# Patient Record
Sex: Male | Born: 1980 | Race: White | Hispanic: No | Marital: Married | Smoking: Current every day smoker
Health system: Southern US, Community
[De-identification: ages and names within clinical notes are randomized; demographics above are authoritative.]

## PROBLEM LIST (undated history)

## (undated) DIAGNOSIS — R1310 Dysphagia, unspecified: Secondary | ICD-10-CM

## (undated) DIAGNOSIS — R519 Headache, unspecified: Secondary | ICD-10-CM

## (undated) DIAGNOSIS — T7840XA Allergy, unspecified, initial encounter: Secondary | ICD-10-CM

## (undated) DIAGNOSIS — J45909 Unspecified asthma, uncomplicated: Secondary | ICD-10-CM

## (undated) DIAGNOSIS — Z8614 Personal history of Methicillin resistant Staphylococcus aureus infection: Secondary | ICD-10-CM

## (undated) DIAGNOSIS — R51 Headache: Secondary | ICD-10-CM

## (undated) DIAGNOSIS — S42001A Fracture of unspecified part of right clavicle, initial encounter for closed fracture: Secondary | ICD-10-CM

## (undated) DIAGNOSIS — I499 Cardiac arrhythmia, unspecified: Secondary | ICD-10-CM

## (undated) HISTORY — PX: CHOLECYSTECTOMY: SHX55

## (undated) HISTORY — PX: WISDOM TOOTH EXTRACTION: SHX21

## (undated) HISTORY — DX: Allergy, unspecified, initial encounter: T78.40XA

## (undated) HISTORY — PX: WRIST RECONSTRUCTION: SHX2675

## (undated) HISTORY — PX: TONSILLECTOMY: SUR1361

---

## 1999-02-06 ENCOUNTER — Encounter: Payer: Self-pay | Admitting: Orthopaedic Surgery

## 1999-02-06 ENCOUNTER — Inpatient Hospital Stay (HOSPITAL_COMMUNITY): Admission: EM | Admit: 1999-02-06 | Discharge: 1999-02-08 | Payer: Self-pay

## 1999-02-06 ENCOUNTER — Encounter: Payer: Self-pay | Admitting: Emergency Medicine

## 1999-06-16 ENCOUNTER — Encounter: Admission: RE | Admit: 1999-06-16 | Discharge: 1999-06-27 | Payer: Self-pay | Admitting: Orthopaedic Surgery

## 2000-02-06 HISTORY — PX: WRIST RECONSTRUCTION: SHX2675

## 2017-08-20 ENCOUNTER — Emergency Department (HOSPITAL_COMMUNITY): Payer: Managed Care, Other (non HMO)

## 2017-08-20 ENCOUNTER — Encounter (HOSPITAL_COMMUNITY): Payer: Self-pay

## 2017-08-20 ENCOUNTER — Other Ambulatory Visit: Payer: Self-pay

## 2017-08-20 ENCOUNTER — Emergency Department (HOSPITAL_COMMUNITY)
Admission: EM | Admit: 2017-08-20 | Discharge: 2017-08-21 | Disposition: A | Discharge status: Home / Self Care | Payer: Managed Care, Other (non HMO) | Attending: Emergency Medicine | Admitting: Emergency Medicine

## 2017-08-20 DIAGNOSIS — Y929 Unspecified place or not applicable: Secondary | ICD-10-CM | POA: Insufficient documentation

## 2017-08-20 DIAGNOSIS — R410 Disorientation, unspecified: Secondary | ICD-10-CM | POA: Diagnosis not present

## 2017-08-20 DIAGNOSIS — Y999 Unspecified external cause status: Secondary | ICD-10-CM | POA: Insufficient documentation

## 2017-08-20 DIAGNOSIS — S42021A Displaced fracture of shaft of right clavicle, initial encounter for closed fracture: Secondary | ICD-10-CM | POA: Insufficient documentation

## 2017-08-20 DIAGNOSIS — R112 Nausea with vomiting, unspecified: Secondary | ICD-10-CM | POA: Diagnosis not present

## 2017-08-20 DIAGNOSIS — Z79899 Other long term (current) drug therapy: Secondary | ICD-10-CM | POA: Diagnosis not present

## 2017-08-20 DIAGNOSIS — S42001A Fracture of unspecified part of right clavicle, initial encounter for closed fracture: Secondary | ICD-10-CM

## 2017-08-20 DIAGNOSIS — Y939 Activity, unspecified: Secondary | ICD-10-CM | POA: Insufficient documentation

## 2017-08-20 DIAGNOSIS — S4991XA Unspecified injury of right shoulder and upper arm, initial encounter: Secondary | ICD-10-CM | POA: Diagnosis present

## 2017-08-20 HISTORY — DX: Fracture of unspecified part of right clavicle, initial encounter for closed fracture: S42.001A

## 2017-08-20 LAB — CBC WITH DIFFERENTIAL/PLATELET
Basophils Absolute: 0 10*3/uL (ref 0.0–0.1)
Basophils Relative: 0 %
Eosinophils Absolute: 0.1 10*3/uL (ref 0.0–0.7)
Eosinophils Relative: 0 %
HEMATOCRIT: 44.7 % (ref 39.0–52.0)
HEMOGLOBIN: 15.1 g/dL (ref 13.0–17.0)
LYMPHS ABS: 1.7 10*3/uL (ref 0.7–4.0)
Lymphocytes Relative: 8 %
MCH: 31.9 pg (ref 26.0–34.0)
MCHC: 33.8 g/dL (ref 30.0–36.0)
MCV: 94.5 fL (ref 78.0–100.0)
MONOS PCT: 6 %
Monocytes Absolute: 1.3 10*3/uL — ABNORMAL HIGH (ref 0.1–1.0)
NEUTROS ABS: 18.3 10*3/uL — AB (ref 1.7–7.7)
NEUTROS PCT: 86 %
Platelets: 276 10*3/uL (ref 150–400)
RBC: 4.73 MIL/uL (ref 4.22–5.81)
RDW: 12.7 % (ref 11.5–15.5)
WBC: 21.3 10*3/uL — ABNORMAL HIGH (ref 4.0–10.5)

## 2017-08-20 MED ORDER — METHOCARBAMOL 500 MG PO TABS
1000.0000 mg | ORAL_TABLET | Freq: Once | ORAL | Status: AC
  Administered 2017-08-20: 1000 mg via ORAL
  Filled 2017-08-20: qty 2

## 2017-08-20 MED ORDER — HYDROCODONE-ACETAMINOPHEN 5-325 MG PO TABS
2.0000 | ORAL_TABLET | Freq: Once | ORAL | Status: AC
  Administered 2017-08-20: 2 via ORAL
  Filled 2017-08-20: qty 2
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
## 2017-08-20 NOTE — ED Notes (Signed)
Medicated for pain and ice pack given for right shoulder.

## 2017-08-21 LAB — COMPREHENSIVE METABOLIC PANEL
ALBUMIN: 4.3 g/dL (ref 3.5–5.0)
ALK PHOS: 69 U/L (ref 38–126)
ALT: 27 U/L (ref 17–63)
ANION GAP: 13 (ref 5–15)
AST: 33 U/L (ref 15–41)
BILIRUBIN TOTAL: 0.7 mg/dL (ref 0.3–1.2)
BUN: 11 mg/dL (ref 6–20)
CALCIUM: 9.2 mg/dL (ref 8.9–10.3)
CO2: 21 mmol/L — ABNORMAL LOW (ref 22–32)
Chloride: 102 mmol/L (ref 101–111)
Creatinine, Ser: 1.04 mg/dL (ref 0.61–1.24)
Glucose, Bld: 116 mg/dL — ABNORMAL HIGH (ref 65–99)
POTASSIUM: 3.9 mmol/L (ref 3.5–5.1)
Sodium: 136 mmol/L (ref 135–145)
Total Protein: 6.5 g/dL (ref 6.5–8.1)

## 2017-08-21 MED ORDER — KETOROLAC TROMETHAMINE 30 MG/ML IJ SOLN
30.0000 mg | Freq: Once | INTRAMUSCULAR | Status: AC
  Administered 2017-08-21: 30 mg via INTRAVENOUS
  Filled 2017-08-21: qty 1

## 2017-08-21 MED ORDER — IBUPROFEN 400 MG PO TABS: 600.0000 mg | ORAL_TABLET | Freq: Once | ORAL | Status: DC

## 2017-08-21 MED ORDER — METHOCARBAMOL 500 MG PO TABS: 500.0000 mg | ORAL_TABLET | Freq: Two times a day (BID) | ORAL | 0 refills | Status: DC

## 2017-08-21 MED ORDER — ONDANSETRON 4 MG PO TBDP: 4.0000 mg | ORAL_TABLET | Freq: Three times a day (TID) | ORAL | 0 refills | Status: DC | PRN

## 2017-08-21 MED ORDER — LIDOCAINE 5 % EX PTCH: 1.0000 | MEDICATED_PATCH | CUTANEOUS | 0 refills | Status: DC

## 2017-08-21 MED ORDER — HYDROCODONE-ACETAMINOPHEN 5-325 MG PO TABS: 1.0000 | ORAL_TABLET | ORAL | 0 refills | Status: DC | PRN

## 2017-08-21 NOTE — Discharge Instructions (Addendum)
°  You have been seen today for a shoulder injury.  There is evidence of a collarbone fracture on the x-ray. Pain: Take 600 mg of ibuprofen every 6 hours or 440 mg (over the counter dose) to 500 mg (prescription dose) of naproxen every 12 hours for the next 3 days. After this time, these medications may be used as needed for pain. Take these medications with food to avoid upset stomach. Choose only one of these medications, do not take them together.  Tylenol: Should you continue to have additional pain while taking the ibuprofen or naproxen, you may add in tylenol as needed. Your daily total maximum amount of tylenol from all sources should be limited to 4000mg /day for persons without liver problems, or 2000mg /day for those with liver problems. Vicodin: May take Vicodin as needed for severe pain.  Do not drive or perform other dangerous activities while taking the Vicodin.  Please note that each pill of Vicodin contains 325 mg of Tylenol and the above dosage limits apply. Robaxin: Robaxin is a muscle relaxer and meant to help with spasming muscles.  Do not drive or perform other dangerous activities while taking the Robaxin. Lidocaine patches: May apply the lidocaine patches directly to painful areas.  You may use the prescription version or over-the-counter version, such as Salonpas. Zofran: May use Zofran for nausea or vomiting. Ice: May apply ice to the area over the next 24 hours for 15 minutes at a time to reduce swelling. Elevation: Keep the injury elevated.  With this type of injury, this typically includes sleeping on a few pillows or in a chair. Support: Wear the arm sling for support and comfort.  Wear this at all times when not showering. Follow up: Follow-up with the orthopedist as soon as possible in this manner.

## 2017-08-26 ENCOUNTER — Other Ambulatory Visit: Payer: Self-pay

## 2017-08-26 ENCOUNTER — Encounter (HOSPITAL_BASED_OUTPATIENT_CLINIC_OR_DEPARTMENT_OTHER): Payer: Self-pay | Admitting: *Deleted

## 2017-08-30 ENCOUNTER — Encounter (HOSPITAL_BASED_OUTPATIENT_CLINIC_OR_DEPARTMENT_OTHER)
Admission: RE | Disposition: A | Discharge status: Home / Self Care | Payer: Self-pay | Source: Ambulatory Visit | Attending: Orthopedic Surgery

## 2017-08-30 ENCOUNTER — Encounter (HOSPITAL_BASED_OUTPATIENT_CLINIC_OR_DEPARTMENT_OTHER): Payer: Self-pay

## 2017-08-30 ENCOUNTER — Other Ambulatory Visit: Payer: Self-pay

## 2017-08-30 ENCOUNTER — Ambulatory Visit (HOSPITAL_BASED_OUTPATIENT_CLINIC_OR_DEPARTMENT_OTHER): Payer: Managed Care, Other (non HMO) | Admitting: Anesthesiology

## 2017-08-30 ENCOUNTER — Ambulatory Visit (HOSPITAL_BASED_OUTPATIENT_CLINIC_OR_DEPARTMENT_OTHER)
Admission: RE | Admit: 2017-08-30 | Discharge: 2017-08-30 | Disposition: A | Discharge status: Home / Self Care | Payer: Managed Care, Other (non HMO) | Source: Ambulatory Visit | Attending: Orthopedic Surgery | Admitting: Orthopedic Surgery

## 2017-08-30 DIAGNOSIS — S42021A Displaced fracture of shaft of right clavicle, initial encounter for closed fracture: Secondary | ICD-10-CM | POA: Diagnosis present

## 2017-08-30 DIAGNOSIS — S42001A Fracture of unspecified part of right clavicle, initial encounter for closed fracture: Secondary | ICD-10-CM

## 2017-08-30 DIAGNOSIS — Y929 Unspecified place or not applicable: Secondary | ICD-10-CM | POA: Insufficient documentation

## 2017-08-30 HISTORY — DX: Unspecified asthma, uncomplicated: J45.909

## 2017-08-30 HISTORY — DX: Fracture of unspecified part of right clavicle, initial encounter for closed fracture: S42.001A

## 2017-08-30 HISTORY — DX: Personal history of Methicillin resistant Staphylococcus aureus infection: Z86.14

## 2017-08-30 HISTORY — DX: Headache, unspecified: R51.9

## 2017-08-30 HISTORY — DX: Cardiac arrhythmia, unspecified: I49.9

## 2017-08-30 HISTORY — DX: Headache: R51

## 2017-08-30 HISTORY — DX: Dysphagia, unspecified: R13.10

## 2017-08-30 HISTORY — PX: ORIF CLAVICULAR FRACTURE: SHX5055

## 2017-08-30 SURGERY — OPEN REDUCTION INTERNAL FIXATION (ORIF) CLAVICULAR FRACTURE
Anesthesia: General | Site: Shoulder | Laterality: Right

## 2017-08-30 MED ORDER — FENTANYL CITRATE (PF) 100 MCG/2ML IJ SOLN
INTRAMUSCULAR | Status: AC
  Filled 2017-08-30: qty 2

## 2017-08-30 MED ORDER — LACTATED RINGERS IV SOLN
INTRAVENOUS | Status: DC
  Administered 2017-08-30 (×2): via INTRAVENOUS

## 2017-08-30 MED ORDER — OXYCODONE HCL 5 MG PO TABS: 5.0000 mg | ORAL_TABLET | ORAL | 0 refills | Status: AC | PRN

## 2017-08-30 MED ORDER — DEXAMETHASONE SODIUM PHOSPHATE 4 MG/ML IJ SOLN
INTRAMUSCULAR | Status: DC | PRN
  Administered 2017-08-30: 10 mg via INTRAVENOUS

## 2017-08-30 MED ORDER — CEFAZOLIN SODIUM-DEXTROSE 2-4 GM/100ML-% IV SOLN
INTRAVENOUS | Status: AC
  Filled 2017-08-30: qty 100

## 2017-08-30 MED ORDER — ONDANSETRON HCL 4 MG PO TABS: 4.0000 mg | ORAL_TABLET | Freq: Three times a day (TID) | ORAL | 0 refills | Status: DC | PRN

## 2017-08-30 MED ORDER — MIDAZOLAM HCL 2 MG/2ML IJ SOLN
1.0000 mg | INTRAMUSCULAR | Status: DC | PRN
  Administered 2017-08-30: 2 mg via INTRAVENOUS

## 2017-08-30 MED ORDER — SUGAMMADEX SODIUM 200 MG/2ML IV SOLN
INTRAVENOUS | Status: DC | PRN
  Administered 2017-08-30: 150 mg via INTRAVENOUS

## 2017-08-30 MED ORDER — DOCUSATE SODIUM 100 MG PO CAPS: 100.0000 mg | ORAL_CAPSULE | Freq: Two times a day (BID) | ORAL | 0 refills | Status: DC

## 2017-08-30 MED ORDER — ACETAMINOPHEN 500 MG PO TABS: 1000.0000 mg | ORAL_TABLET | Freq: Three times a day (TID) | ORAL | 0 refills | Status: AC

## 2017-08-30 MED ORDER — ROCURONIUM BROMIDE 100 MG/10ML IV SOLN
INTRAVENOUS | Status: DC | PRN
  Administered 2017-08-30: 10 mg via INTRAVENOUS
  Administered 2017-08-30: 60 mg via INTRAVENOUS

## 2017-08-30 MED ORDER — CEFAZOLIN SODIUM-DEXTROSE 2-4 GM/100ML-% IV SOLN
2.0000 g | INTRAVENOUS | Status: AC
  Administered 2017-08-30: 2 g via INTRAVENOUS

## 2017-08-30 MED ORDER — GABAPENTIN 300 MG PO CAPS
ORAL_CAPSULE | ORAL | Status: AC
  Filled 2017-08-30: qty 1

## 2017-08-30 MED ORDER — GABAPENTIN 300 MG PO CAPS
300.0000 mg | ORAL_CAPSULE | Freq: Once | ORAL | Status: AC
  Administered 2017-08-30: 300 mg via ORAL

## 2017-08-30 MED ORDER — ONDANSETRON HCL 4 MG/2ML IJ SOLN: 4.0000 mg | Freq: Once | INTRAMUSCULAR | Status: DC | PRN

## 2017-08-30 MED ORDER — PHENYLEPHRINE HCL 10 MG/ML IJ SOLN
INTRAMUSCULAR | Status: DC | PRN
  Administered 2017-08-30 (×3): 80 ug via INTRAVENOUS

## 2017-08-30 MED ORDER — MIDAZOLAM HCL 2 MG/2ML IJ SOLN
INTRAMUSCULAR | Status: AC
  Filled 2017-08-30: qty 2

## 2017-08-30 MED ORDER — ONDANSETRON HCL 4 MG/2ML IJ SOLN
INTRAMUSCULAR | Status: DC | PRN
  Administered 2017-08-30: 4 mg via INTRAVENOUS

## 2017-08-30 MED ORDER — CHLORHEXIDINE GLUCONATE 4 % EX LIQD: 60.0000 mL | Freq: Once | CUTANEOUS | Status: DC

## 2017-08-30 MED ORDER — SCOPOLAMINE 1 MG/3DAYS TD PT72: 1.0000 | MEDICATED_PATCH | Freq: Once | TRANSDERMAL | Status: DC | PRN

## 2017-08-30 MED ORDER — LACTATED RINGERS IV SOLN: INTRAVENOUS | Status: DC

## 2017-08-30 MED ORDER — ACETAMINOPHEN 500 MG PO TABS
ORAL_TABLET | ORAL | Status: AC
  Filled 2017-08-30: qty 2

## 2017-08-30 MED ORDER — BUPIVACAINE-EPINEPHRINE (PF) 0.25% -1:200000 IJ SOLN
INTRAMUSCULAR | Status: AC
  Filled 2017-08-30: qty 30

## 2017-08-30 MED ORDER — PHENYLEPHRINE 40 MCG/ML (10ML) SYRINGE FOR IV PUSH (FOR BLOOD PRESSURE SUPPORT)
PREFILLED_SYRINGE | INTRAVENOUS | Status: AC
  Filled 2017-08-30: qty 10

## 2017-08-30 MED ORDER — FENTANYL CITRATE (PF) 100 MCG/2ML IJ SOLN
25.0000 ug | INTRAMUSCULAR | Status: DC | PRN
  Administered 2017-08-30 (×2): 50 ug via INTRAVENOUS

## 2017-08-30 MED ORDER — PROPOFOL 10 MG/ML IV BOLUS
INTRAVENOUS | Status: DC | PRN
  Administered 2017-08-30: 200 mg via INTRAVENOUS

## 2017-08-30 MED ORDER — PROPOFOL 10 MG/ML IV BOLUS
INTRAVENOUS | Status: AC
  Filled 2017-08-30: qty 20

## 2017-08-30 MED ORDER — FENTANYL CITRATE (PF) 100 MCG/2ML IJ SOLN
50.0000 ug | INTRAMUSCULAR | Status: AC | PRN
  Administered 2017-08-30: 100 ug via INTRAVENOUS
  Administered 2017-08-30 (×2): 25 ug via INTRAVENOUS
  Administered 2017-08-30: 50 ug via INTRAVENOUS

## 2017-08-30 MED ORDER — ACETAMINOPHEN 500 MG PO TABS
1000.0000 mg | ORAL_TABLET | Freq: Once | ORAL | Status: AC
  Administered 2017-08-30: 1000 mg via ORAL

## 2017-08-30 MED ORDER — LIDOCAINE HCL (CARDIAC) PF 100 MG/5ML IV SOSY
PREFILLED_SYRINGE | INTRAVENOUS | Status: DC | PRN
  Administered 2017-08-30: 30 mg via INTRAVENOUS

## 2017-08-30 SURGICAL SUPPLY — 66 items
AID PSTN UNV HD RSTRNT DISP (MISCELLANEOUS) ×1
BIT DRILL 2.6 (BIT) ×2 IMPLANT
BIT DRILL 3.5MM (BIT) ×1
BIT DRILL OVERDRILL 3.5X122 (BIT) ×1 IMPLANT
BLADE CLIPPER SURG (BLADE) IMPLANT
BLADE SURG 15 STRL LF DISP TIS (BLADE) ×2 IMPLANT
BLADE SURG 15 STRL SS (BLADE) ×6
CHLORAPREP W/TINT 26ML (MISCELLANEOUS) ×3 IMPLANT
CLOSURE STERI-STRIP 1/2X4 (GAUZE/BANDAGES/DRESSINGS)
CLSR STERI-STRIP ANTIMIC 1/2X4 (GAUZE/BANDAGES/DRESSINGS) IMPLANT
DECANTER SPIKE VIAL GLASS SM (MISCELLANEOUS) IMPLANT
DRAPE IMP U-DRAPE 54X76 (DRAPES) ×3 IMPLANT
DRAPE INCISE IOBAN 66X45 STRL (DRAPES) ×3 IMPLANT
DRAPE OEC MINIVIEW 54X84 (DRAPES) IMPLANT
DRAPE U-SHAPE 47X51 STRL (DRAPES) ×3 IMPLANT
DRAPE U-SHAPE 76X120 STRL (DRAPES) ×6 IMPLANT
DRILL 2.6X220MM LONG AO (BIT) ×2 IMPLANT
DRSG MEPILEX BORDER 4X8 (GAUZE/BANDAGES/DRESSINGS) IMPLANT
DRSG TEGADERM 4X4.75 (GAUZE/BANDAGES/DRESSINGS) IMPLANT
ELECT REM PT RETURN 9FT ADLT (ELECTROSURGICAL) ×3
ELECTRODE REM PT RTRN 9FT ADLT (ELECTROSURGICAL) ×1 IMPLANT
GAUZE SPONGE 4X4 12PLY STRL (GAUZE/BANDAGES/DRESSINGS) IMPLANT
GAUZE SPONGE 4X4 12PLY STRL LF (GAUZE/BANDAGES/DRESSINGS) IMPLANT
GAUZE XEROFORM 1X8 LF (GAUZE/BANDAGES/DRESSINGS) IMPLANT
GLOVE BIO SURGEON STRL SZ7.5 (GLOVE) ×6 IMPLANT
GLOVE BIOGEL PI IND STRL 7.0 (GLOVE) IMPLANT
GLOVE BIOGEL PI IND STRL 8 (GLOVE) ×2 IMPLANT
GLOVE BIOGEL PI INDICATOR 7.0 (GLOVE) ×4
GLOVE BIOGEL PI INDICATOR 8 (GLOVE) ×4
GLOVE ECLIPSE 6.5 STRL STRAW (GLOVE) ×2 IMPLANT
GOWN STRL REUS W/ TWL LRG LVL3 (GOWN DISPOSABLE) ×2 IMPLANT
GOWN STRL REUS W/ TWL XL LVL3 (GOWN DISPOSABLE) ×1 IMPLANT
GOWN STRL REUS W/TWL LRG LVL3 (GOWN DISPOSABLE) ×6
GOWN STRL REUS W/TWL XL LVL3 (GOWN DISPOSABLE) ×3
NS IRRIG 1000ML POUR BTL (IV SOLUTION) ×3 IMPLANT
PACK ARTHROSCOPY DSU (CUSTOM PROCEDURE TRAY) ×3 IMPLANT
PACK BASIN DAY SURGERY FS (CUSTOM PROCEDURE TRAY) ×3 IMPLANT
PENCIL BUTTON HOLSTER BLD 10FT (ELECTRODE) ×3 IMPLANT
PLATE SUPERIOR MIDSHAFT 7H RT (Plate) ×2 IMPLANT
RESTRAINT HEAD UNIVERSAL NS (MISCELLANEOUS) ×3 IMPLANT
SCREW BONE 3.5X16MM (Screw) ×10 IMPLANT
SCREW BONE 3.5X18MM (Screw) ×2 IMPLANT
SCREW BONE 3.5X20MM (Screw) ×4 IMPLANT
SLEEVE SCD COMPRESS KNEE MED (MISCELLANEOUS) ×3 IMPLANT
SLING ARM FOAM STRAP LRG (SOFTGOODS) ×2 IMPLANT
SLING ARM IMMOBILIZER LRG (SOFTGOODS) IMPLANT
SLING ARM IMMOBILIZER MED (SOFTGOODS) IMPLANT
SLING ARM MED ADULT FOAM STRAP (SOFTGOODS) IMPLANT
SLING ARM XL FOAM STRAP (SOFTGOODS) IMPLANT
SPONGE LAP 18X18 RF (DISPOSABLE) ×3 IMPLANT
SUCTION FRAZIER HANDLE 10FR (MISCELLANEOUS) ×2
SUCTION TUBE FRAZIER 10FR DISP (MISCELLANEOUS) IMPLANT
SUT ETHILON 3 0 PS 1 (SUTURE) IMPLANT
SUT FIBERWIRE #2 38 T-5 BLUE (SUTURE)
SUT MNCRL AB 3-0 PS2 18 (SUTURE) ×3 IMPLANT
SUT MNCRL AB 4-0 PS2 18 (SUTURE) ×3 IMPLANT
SUT MON AB 2-0 CT1 36 (SUTURE) IMPLANT
SUT VIC AB 0 CT1 27 (SUTURE) ×3
SUT VIC AB 0 CT1 27XBRD ANBCTR (SUTURE) ×1 IMPLANT
SUT VIC AB 2-0 SH 27 (SUTURE) ×3
SUT VIC AB 2-0 SH 27XBRD (SUTURE) IMPLANT
SUTURE FIBERWR #2 38 T-5 BLUE (SUTURE) IMPLANT
SYR BULB 3OZ (MISCELLANEOUS) ×3 IMPLANT
TOWEL OR 17X24 6PK STRL BLUE (TOWEL DISPOSABLE) ×3 IMPLANT
TOWEL OR NON WOVEN STRL DISP B (DISPOSABLE) ×3 IMPLANT
YANKAUER SUCT BULB TIP NO VENT (SUCTIONS) ×3 IMPLANT

## 2017-08-30 NOTE — Interval H&P Note (Signed)
History and Physical Interval Note:  08/30/2017 12:54 PM  Evan Wood  has presented today for surgery, with the diagnosis of FRACTURE OF CLAVICLE RIGHT  The various methods of treatment have been discussed with the patient and family. After consideration of risks, benefits and other options for treatment, the patient has consented to  Procedure(s): OPEN REDUCTION INTERNAL FIXATION (ORIF) CLAVICULAR FRACTUREb RIGHT (Right) as a surgical intervention .  The patient's history has been reviewed, patient examined, no change in status, stable for surgery.  I have reviewed the patient's chart and labs.  Questions were answered to the patient's satisfaction.     Brigham Cobbins D

## 2017-08-30 NOTE — Progress Notes (Signed)
Assisted Dr. Joslin with left, ultrasound guided, interscalene  block. Side rails up, monitors on throughout procedure. See vital signs in flow sheet. Tolerated Procedure well. 

## 2017-08-30 NOTE — Anesthesia Postprocedure Evaluation (Signed)
Anesthesia Post Note  Patient: Evan Wood  Procedure(s) Performed: OPEN REDUCTION INTERNAL FIXATION (ORIF) CLAVICULAR FRACTUREb RIGHT (Right Shoulder)     Patient location during evaluation: PACU Anesthesia Type: General and Regional Level of consciousness: awake and alert Pain management: pain level controlled Vital Signs Assessment: post-procedure vital signs reviewed and stable Respiratory status: spontaneous breathing, nonlabored ventilation, respiratory function stable and patient connected to nasal cannula oxygen Cardiovascular status: blood pressure returned to baseline and stable Postop Assessment: no apparent nausea or vomiting Anesthetic complications: no    Last Vitals:  Vitals:   08/30/17 1445 08/30/17 1500  BP: 114/73 135/89  Pulse: 61 78  Resp: 12 (!) 9  Temp: 36.5 C   SpO2: 99% 97%    Last Pain:  Vitals:   08/30/17 1500  PainSc: 7                  Bentlie Catanzaro COKER

## 2017-08-30 NOTE — Discharge Instructions (Signed)
Apply ice to reduce pain and swelling. You may increase pain medication up to 2 tablets every 4 hours for the first few days post op if needed.  Diet: As you were doing prior to hospitalization   Dressing:  Keep dressing on and dry until follow up.  Activity:  Increase activity slowly as tolerated, but follow the weight bearing instructions below.  The rules on driving is that you can not be taking narcotics while you drive, and you must feel in control of the vehicle.    Weight Bearing: Non weight bearing affected arm.  Maintain sling at all times.    To prevent constipation: you may use a stool softener such as -  Colace (over the counter) 100 mg by mouth twice a day  Drink plenty of fluids (prune juice may be helpful) and high fiber foods Miralax (over the counter) for constipation as needed.    Itching:  If you experience itching with your medications, try taking only a single pain pill, or even half a pain pill at a time.  You can also use benadryl over the counter for itching or also to help with sleep.   Precautions:  If you experience chest pain or shortness of breath - call 911 immediately for transfer to the hospital emergency department!!  If you develop a fever greater that 101 F, purulent drainage from wound, increased redness or drainage from wound, or calf pain -- Call the office at [phone]                                                 Follow- Up Appointment:  Please call for an appointment to be seen in 2 weeks [place] - [phone]    Post Anesthesia Home Care Instructions  Activity: Get plenty of rest for the remainder of the day. A responsible individual must stay with you for 24 hours following the procedure.  For the next 24 hours, DO NOT: -Drive a car -Advertising copywriter -Drink alcoholic beverages -Take any medication unless instructed by your physician -Make any legal decisions or sign important papers.  Meals: Start with liquid foods such as  gelatin or soup. Progress to regular foods as tolerated. Avoid greasy, spicy, heavy foods. If nausea and/or vomiting occur, drink only clear liquids until the nausea and/or vomiting subsides. Call your physician if vomiting continues.  Special Instructions/Symptoms: Your throat may feel dry or sore from the anesthesia or the breathing tube placed in your throat during surgery. If this causes discomfort, gargle with warm salt water. The discomfort should disappear within 24 hours.  If you had a scopolamine patch placed behind your ear for the management of post- operative nausea and/or vomiting:  1. The medication in the patch is effective for 72 hours, after which it should be removed.  Wrap patch in a tissue and discard in the trash. Wash hands thoroughly with soap and water. 2. You may remove the patch earlier than 72 hours if you experience unpleasant side effects which may include dry mouth, dizziness or visual disturbances. 3. Avoid touching the patch. Wash your hands with soap and water after contact with the patch.     Regional Anesthesia Blocks  1. Numbness or the inability to move the "blocked" extremity may last from 3-48 hours after placement. The length of time depends on the medication injected and  your individual response to the medication. If the numbness is not going away after 48 hours, call your surgeon.  2. The extremity that is blocked will need to be protected until the numbness is gone and the  Strength has returned. Because you cannot feel it, you will need to take extra care to avoid injury. Because it may be weak, you may have difficulty moving it or using it. You may not know what position it is in without looking at it while the block is in effect.  3. For blocks in the legs and feet, returning to weight bearing and walking needs to be done carefully. You will need to wait until the numbness is entirely gone and the strength has returned. You should be able to move your  leg and foot normally before you try and bear weight or walk. You will need someone to be with you when you first try to ensure you do not fall and possibly risk injury.  4. Bruising and tenderness at the needle site are common side effects and will resolve in a few days.  5. Persistent numbness or new problems with movement should be communicated to the surgeon or the [facility] [phone] [facility] [phone]).

## 2017-08-30 NOTE — Anesthesia Procedure Notes (Signed)
Anesthesia Regional Block: Interscalene brachial plexus block   Pre-Anesthetic Checklist: ,, timeout performed, Correct Patient, Correct Site, Correct Laterality, Correct Procedure, Correct Position, site marked, Risks and benefits discussed,  Surgical consent,  Pre-op evaluation,  At surgeon's request and post-op pain management  Laterality: Right  Prep: chloraprep       Needles:  Injection technique: Single-shot  Needle Type: Stimulator Needle - 40      Needle Gauge: 22     Additional Needles:   Procedures:, nerve stimulator,,,,,,,  Narrative:  Start time: 08/30/2017 11:40 AM End time: 08/30/2017 11:50 AM Injection made incrementally with aspirations every 5 mL.  Performed by: Personally   Additional Notes: 20 cc 0.75% Ropivacaine injected easily

## 2017-08-30 NOTE — Transfer of Care (Signed)
Immediate Anesthesia Transfer of Care Note  Patient: Evan Wood  Procedure(s) Performed: OPEN REDUCTION INTERNAL FIXATION (ORIF) CLAVICULAR FRACTUREb RIGHT (Right Shoulder)  Patient Location: PACU  Anesthesia Type:GA combined with regional for post-op pain  Level of Consciousness: drowsy  Airway & Oxygen Therapy: Patient Spontanous Breathing and Patient connected to face mask oxygen  Post-op Assessment: Report given to RN and Post -op Vital signs reviewed and stable  Post vital signs: Reviewed and stable  Last Vitals:  Vitals Value Taken Time  BP 114/73 08/30/2017  2:45 PM  Temp    Pulse 64 08/30/2017  2:47 PM  Resp 14 08/30/2017  2:47 PM  SpO2 98 % 08/30/2017  2:47 PM  Vitals shown include unvalidated device data.  Last Pain:  Vitals:   08/30/17 1158  PainSc: 3       Patients Stated Pain Goal: 5 (08/30/17 1047)  Complications: No apparent anesthesia complications

## 2017-08-30 NOTE — Anesthesia Procedure Notes (Signed)
Procedure Name: Intubation Date/Time: 08/30/2017 1:12 PM Performed by: Signe Colt, CRNA Pre-anesthesia Checklist: Patient identified, Emergency Drugs available, Suction available and Patient being monitored Patient Re-evaluated:Patient Re-evaluated prior to induction Oxygen Delivery Method: Circle system utilized Preoxygenation: Pre-oxygenation with 100% oxygen Induction Type: IV induction Ventilation: Mask ventilation without difficulty Laryngoscope Size: Mac and 3 Grade View: Grade I Tube type: Oral Tube size: 7.0 mm Number of attempts: 1 Airway Equipment and Method: Stylet and Oral airway Placement Confirmation: ETT inserted through vocal cords under direct vision,  positive ETCO2 and breath sounds checked- equal and bilateral Secured at: 21 cm Tube secured with: Tape Dental Injury: Teeth and Oropharynx as per pre-operative assessment

## 2017-08-30 NOTE — Progress Notes (Signed)
Pt's right arm has greater vessel dilation and more flush than his  Left arm. Dr Noreene Larsson notified and said this is common with an interscalene block.

## 2017-08-30 NOTE — Op Note (Signed)
08/30/2017  2:08 PM  PATIENT:  Evan Wood    PRE-OPERATIVE DIAGNOSIS:  FRACTURE OF CLAVICLE RIGHT  POST-OPERATIVE DIAGNOSIS:  Same  PROCEDURE:  OPEN REDUCTION INTERNAL FIXATION (ORIF) CLAVICULAR FRACTUREb RIGHT  SURGEON:  Lee-Ann Gal, Jewel Baize, MD  PHYSICIAN ASSISTANT: Aquilla Hacker, PA-C, he was present and scrubbed throughout the case, critical for completion in a timely fashion, and for retraction, instrumentation, and closure.   ANESTHESIA:   General  PREOPERATIVE INDICATIONS:  Evan Wood is a  37 y.o. male with a diagnosis of FRACTURE OF CLAVICLE RIGHT who elected for surgical management based on preoperative shortening and angulation and displacement of the fracture.    The risks benefits and alternatives were discussed with the patient preoperatively including but not limited to the risks of infection, bleeding, nerve injury, malunion, nonunion, hardware failure, the need for hardware removal, recurrent fracture, cardiopulmonary complications, the need for revision surgery, among others, and the patient was willing to proceed.    OPERATIVE IMPLANTS: stryker 7 hole clavicle plate  OPERATIVE FINDINGS: Shortened, displaced clavicle fracture  OPERATIVE PROCEDURE: The patient was brought to the operating room and placed in the supine position. General anesthesia was administered. IV antibiotics were given. He was placed in the beach chair position. The upper extremity was prepped and draped in the usual sterile fashion. Time out was performed. Incision was made over the clavicle fracture. Dissection was carried down through the platysma, and the fracture site exposed. The fracture was extremely short.  I ultimately did however achieve satisfactory mobilization, and was able to reduce the fracture anatomically.   I placed a clamp and 2 lag screws.  I selected a 7 hole plate and place 3 bicortical screws on each side of the fracture with an excellen purchase.   I had  excellent bony apposition and restoration of anatomic alignment of the clavicle. Used C-arm to confirm appropriate alignment, reduction of the fracture, and positioning of the plate and length of the screws.  I then took final C-arm pictures, irrigated the wounds copiously, and repaired the fascia with inverted figure-of-eight Vicryl suture. The subcutaneous tissue was closed with Vicryl as well, and the skin closed with steri-strips, and the patient was awakened and returned to the PACU in stable and satisfactory condition. There were no complications.   POSTOPERATIVE PLAN: Sling full time, DVT px: ambulation
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
## 2017-08-30 NOTE — Anesthesia Preprocedure Evaluation (Signed)
Anesthesia Evaluation  Patient identified by MRN, date of birth, ID band Patient awake    Reviewed: Allergy & Precautions, NPO status , Patient's Chart, lab work & pertinent test results  Airway Mallampati: II  TM Distance: >3 FB Neck ROM: Full    Dental  (+) Teeth Intact, Dental Advisory Given   Pulmonary Current Smoker,    breath sounds clear to auscultation       Cardiovascular  Rhythm:Regular Rate:Normal     Neuro/Psych    GI/Hepatic   Endo/Other    Renal/GU      Musculoskeletal   Abdominal   Peds  Hematology   Anesthesia Other Findings   Reproductive/Obstetrics                             Anesthesia Physical Anesthesia Plan  ASA: II  Anesthesia Plan: General   Post-op Pain Management:  Regional for Post-op pain   Induction: Intravenous  PONV Risk Score and Plan: Ondansetron and Dexamethasone  Airway Management Planned: Oral ETT  Additional Equipment:   Intra-op Plan:   Post-operative Plan: Extubation in OR  Informed Consent: I have reviewed the patients History and Physical, chart, labs and discussed the procedure including the risks, benefits and alternatives for the proposed anesthesia with the patient or authorized representative who has indicated his/her understanding and acceptance.   Dental advisory given  Plan Discussed with: CRNA and Anesthesiologist  Anesthesia Plan Comments:         Anesthesia Quick Evaluation

## 2017-09-03 ENCOUNTER — Encounter (HOSPITAL_BASED_OUTPATIENT_CLINIC_OR_DEPARTMENT_OTHER): Payer: Self-pay | Admitting: Orthopedic Surgery
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
## 2024-01-21 DIAGNOSIS — M791 Myalgia, unspecified site: Secondary | ICD-10-CM | POA: Diagnosis not present

## 2024-01-21 DIAGNOSIS — R509 Fever, unspecified: Secondary | ICD-10-CM | POA: Diagnosis not present

## 2024-01-21 DIAGNOSIS — R059 Cough, unspecified: Secondary | ICD-10-CM | POA: Diagnosis not present

## 2024-01-21 DIAGNOSIS — S39012A Strain of muscle, fascia and tendon of lower back, initial encounter: Secondary | ICD-10-CM | POA: Diagnosis not present

## 2024-02-04 ENCOUNTER — Other Ambulatory Visit: Payer: Self-pay

## 2024-02-04 ENCOUNTER — Emergency Department (HOSPITAL_COMMUNITY)
Admission: EM | Admit: 2024-02-04 | Discharge: 2024-02-05 | Attending: Emergency Medicine | Admitting: Emergency Medicine

## 2024-02-04 ENCOUNTER — Emergency Department (HOSPITAL_COMMUNITY)

## 2024-02-04 ENCOUNTER — Encounter (HOSPITAL_COMMUNITY): Payer: Self-pay

## 2024-02-04 DIAGNOSIS — R109 Unspecified abdominal pain: Secondary | ICD-10-CM | POA: Diagnosis not present

## 2024-02-04 DIAGNOSIS — Z9049 Acquired absence of other specified parts of digestive tract: Secondary | ICD-10-CM | POA: Diagnosis not present

## 2024-02-04 DIAGNOSIS — Z5321 Procedure and treatment not carried out due to patient leaving prior to being seen by health care provider: Secondary | ICD-10-CM | POA: Insufficient documentation

## 2024-02-04 DIAGNOSIS — R10A2 Flank pain, left side: Secondary | ICD-10-CM | POA: Diagnosis not present

## 2024-02-04 DIAGNOSIS — R1032 Left lower quadrant pain: Secondary | ICD-10-CM | POA: Diagnosis not present

## 2024-02-04 DIAGNOSIS — M545 Low back pain, unspecified: Secondary | ICD-10-CM | POA: Insufficient documentation

## 2024-02-04 DIAGNOSIS — R9341 Abnormal radiologic findings on diagnostic imaging of renal pelvis, ureter, or bladder: Secondary | ICD-10-CM | POA: Diagnosis not present

## 2024-02-04 LAB — CBC
HCT: 46 % (ref 39.0–52.0)
Hemoglobin: 15.4 g/dL (ref 13.0–17.0)
MCH: 31.4 pg (ref 26.0–34.0)
MCHC: 33.5 g/dL (ref 30.0–36.0)
MCV: 93.7 fL (ref 80.0–100.0)
Platelets: 308 K/uL (ref 150–400)
RBC: 4.91 MIL/uL (ref 4.22–5.81)
RDW: 11.9 % (ref 11.5–15.5)
WBC: 9.7 K/uL (ref 4.0–10.5)
nRBC: 0 % (ref 0.0–0.2)

## 2024-02-04 LAB — URINALYSIS, ROUTINE W REFLEX MICROSCOPIC
Bilirubin Urine: NEGATIVE
Glucose, UA: NEGATIVE mg/dL
Hgb urine dipstick: NEGATIVE
Ketones, ur: NEGATIVE mg/dL
Leukocytes,Ua: NEGATIVE
Nitrite: NEGATIVE
Protein, ur: NEGATIVE mg/dL
Specific Gravity, Urine: 1.002 — ABNORMAL LOW (ref 1.005–1.030)
pH: 6 (ref 5.0–8.0)

## 2024-02-04 LAB — BASIC METABOLIC PANEL WITH GFR
Anion gap: 8 (ref 5–15)
BUN: 18 mg/dL (ref 6–20)
CO2: 26 mmol/L (ref 22–32)
Calcium: 9.4 mg/dL (ref 8.9–10.3)
Chloride: 104 mmol/L (ref 98–111)
Creatinine, Ser: 1.25 mg/dL — ABNORMAL HIGH (ref 0.61–1.24)
GFR, Estimated: >60 mL/min (ref >60)
Glucose, Bld: 101 mg/dL — ABNORMAL HIGH (ref 70–99)
Potassium: 4.1 mmol/L (ref 3.5–5.1)
Sodium: 138 mmol/L (ref 135–145)

## 2024-02-04 MED ORDER — IBUPROFEN 800 MG PO TABS
800.0000 mg | ORAL_TABLET | Freq: Once | ORAL | Status: AC
  Administered 2024-02-04: 800 mg via ORAL
  Filled 2024-02-04: qty 1

## 2024-02-04 NOTE — ED Triage Notes (Signed)
 Pt is coming in for lower back pain/left flank pain, this has been going on for 3 weeks was seen 2 weeks ago and was given muscle relaxants and abx ( for sinus infection ) but now he still is having the left flank/back pain. Pt is otherwise stable with no other complaints at this time.

## 2024-02-04 NOTE — ED Provider Triage Note (Signed)
 Emergency Medicine Provider Triage Evaluation Note  Evan Wood , a 43 y.o. male hx of kidney stones was evaluated in triage.  Pt complains of L sided flank pain. Radiates to front of abdomen. No dysuria or frequency  Review of Systems  Positive: See above Negative: vomiting  Physical Exam  BP (!) 129/90 (BP Location: Right Arm)   Pulse 82   Temp 98.7 F (37.1 C)   Resp 16   SpO2 99%  Gen:   Awake, no distress   Resp:  Normal effort  MSK:   Moves extremities without difficulty  Other:    Medical Decision Making  Medically screening exam initiated at 8:21 PM.  Appropriate orders placed.  Evan Wood was informed that the remainder of the evaluation will be completed by another provider, this initial triage assessment does not replace that evaluation, and the importance of remaining in the ED until their evaluation is complete.   Evan Lamar BROCKS, MD 02/04/24 2022

## 2024-02-04 NOTE — ED Triage Notes (Signed)
 Patient sent form UC for possible kidney stone. Treated for muscle strain with muscle relaxer with no relief. Left flank flain has been present for the past 3 weeks.  No urinary symptoms.

## 2024-02-05 NOTE — ED Notes (Signed)
 Pt states he will come back tomorrow to ask questions about his lab results

## 2024-02-06 ENCOUNTER — Ambulatory Visit (HOSPITAL_BASED_OUTPATIENT_CLINIC_OR_DEPARTMENT_OTHER): Admitting: Student

## 2024-02-06 ENCOUNTER — Ambulatory Visit (HOSPITAL_BASED_OUTPATIENT_CLINIC_OR_DEPARTMENT_OTHER): Payer: Self-pay | Admitting: Student

## 2024-02-06 ENCOUNTER — Encounter (HOSPITAL_BASED_OUTPATIENT_CLINIC_OR_DEPARTMENT_OTHER): Payer: Self-pay | Admitting: Student

## 2024-02-06 VITALS — BP 133/88 | HR 88 | Temp 98.1°F | Resp 16 | Ht 75.0 in | Wt 191.0 lb

## 2024-02-06 DIAGNOSIS — B359 Dermatophytosis, unspecified: Secondary | ICD-10-CM | POA: Diagnosis not present

## 2024-02-06 DIAGNOSIS — R300 Dysuria: Secondary | ICD-10-CM | POA: Diagnosis not present

## 2024-02-06 DIAGNOSIS — Z91030 Bee allergy status: Secondary | ICD-10-CM

## 2024-02-06 DIAGNOSIS — R81 Glycosuria: Secondary | ICD-10-CM

## 2024-02-06 DIAGNOSIS — R10A2 Flank pain, left side: Secondary | ICD-10-CM | POA: Diagnosis not present

## 2024-02-06 DIAGNOSIS — Z7689 Persons encountering health services in other specified circumstances: Secondary | ICD-10-CM

## 2024-02-06 LAB — POCT URINALYSIS DIP (CLINITEK)
Bilirubin, UA: NEGATIVE
Glucose, UA: 250 mg/dL — AB
Ketones, POC UA: NEGATIVE mg/dL
Leukocytes, UA: NEGATIVE
Nitrite, UA: POSITIVE — AB
POC PROTEIN,UA: NEGATIVE
Spec Grav, UA: 1.015 (ref 1.010–1.025)
Urobilinogen, UA: 0.2 U/dL
pH, UA: 5.5 (ref 5.0–8.0)

## 2024-02-06 MED ORDER — TERBINAFINE HCL 250 MG PO TABS: 250.0000 mg | ORAL_TABLET | Freq: Every day | ORAL | 0 refills | Status: AC

## 2024-02-06 MED ORDER — EPINEPHRINE 0.3 MG/0.3ML IJ SOAJ: 0.3000 mg | INTRAMUSCULAR | 3 refills | Status: AC | PRN

## 2024-02-06 NOTE — Patient Instructions (Signed)
 It was nice to see you today!  If you have any problems before your next visit feel free to message me via MyChart (minor issues or questions) or call the office, otherwise you may reach out to schedule an office visit.  Thank you! Pau Banh, PA-C
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
## 2024-02-07 ENCOUNTER — Encounter (HOSPITAL_BASED_OUTPATIENT_CLINIC_OR_DEPARTMENT_OTHER): Payer: Self-pay

## 2024-02-10 ENCOUNTER — Telehealth (HOSPITAL_BASED_OUTPATIENT_CLINIC_OR_DEPARTMENT_OTHER): Payer: Self-pay | Admitting: Student

## 2024-02-10 ENCOUNTER — Telehealth (HOSPITAL_BASED_OUTPATIENT_CLINIC_OR_DEPARTMENT_OTHER): Payer: Self-pay

## 2024-02-10 LAB — CBC WITH DIFFERENTIAL/PLATELET
Basophils Absolute: 0.1 x10E3/uL (ref 0.0–0.2)
Basos: 1 %
EOS (ABSOLUTE): 0.2 x10E3/uL (ref 0.0–0.4)
Eos: 2 %
Hematocrit: 51.2 % — ABNORMAL HIGH (ref 37.5–51.0)
Hemoglobin: 16.9 g/dL (ref 13.0–17.7)
Immature Grans (Abs): 0 x10E3/uL (ref 0.0–0.1)
Immature Granulocytes: 0 %
Lymphocytes Absolute: 2.7 x10E3/uL (ref 0.7–3.1)
Lymphs: 22 %
MCH: 31.2 pg (ref 26.6–33.0)
MCHC: 33 g/dL (ref 31.5–35.7)
MCV: 95 fL (ref 79–97)
Monocytes Absolute: 0.6 x10E3/uL (ref 0.1–0.9)
Monocytes: 5 %
Neutrophils Absolute: 8.5 x10E3/uL — ABNORMAL HIGH (ref 1.4–7.0)
Neutrophils: 70 %
Platelets: 346 x10E3/uL (ref 150–450)
RBC: 5.42 x10E6/uL (ref 4.14–5.80)
RDW: 12.3 % (ref 11.6–15.4)
WBC: 12.2 x10E3/uL — ABNORMAL HIGH (ref 3.4–10.8)

## 2024-02-10 LAB — URINE CULTURE: Organism ID, Bacteria: NO GROWTH

## 2024-02-10 LAB — LIPASE: Lipase: 274 U/L — ABNORMAL HIGH (ref 13–78)

## 2024-02-10 LAB — PSA: Prostate Specific Ag, Serum: 0.4 ng/mL (ref 0.0–4.0)

## 2024-02-10 LAB — HEMOGLOBIN A1C
Est. average glucose Bld gHb Est-mCnc: 120 mg/dL
Hgb A1c MFr Bld: 5.8 % — ABNORMAL HIGH (ref 4.8–5.6)

## 2024-02-10 NOTE — Telephone Encounter (Signed)
 Copied from CRM [id number]. Topic: Clinical - Lab/Test Results >> Feb 10, 2024  4:28 PM Antwanette L wrote: Reason for CRM: Sonny, the pt wife is calling to lab results from 02/06/24. Sonny is requesting is callback when results are ready. Sonny phone number  [phone]

## 2024-02-10 NOTE — Telephone Encounter (Signed)
 Communication  Reason for CRM: Sonny, the pt wife is calling to lab results from 02/06/24. Sonny is requesting is callback when results are ready. Sonny phone number  [phone]

## 2024-02-11 ENCOUNTER — Other Ambulatory Visit (HOSPITAL_BASED_OUTPATIENT_CLINIC_OR_DEPARTMENT_OTHER): Payer: Self-pay | Admitting: Student

## 2024-02-11 DIAGNOSIS — K219 Gastro-esophageal reflux disease without esophagitis: Secondary | ICD-10-CM

## 2024-02-11 MED ORDER — PANTOPRAZOLE SODIUM 40 MG PO TBEC: 40.0000 mg | DELAYED_RELEASE_TABLET | Freq: Every day | ORAL | 3 refills | Status: AC

## 2024-02-11 NOTE — Progress Notes (Signed)
 Discussed labs with patient over the phone. Has been having a lot of acid reflux. Imaging was negative for pancreatitis. Suspect possible ulcer with abdominal pain radiating to the back. Will cover with protonix and he is to let meknow how things are going in a couple of weeks. Discussed admin in the morning before food by 30 minutes.

## 2024-03-09 ENCOUNTER — Ambulatory Visit (HOSPITAL_BASED_OUTPATIENT_CLINIC_OR_DEPARTMENT_OTHER): Admitting: Student

## 2024-03-09 ENCOUNTER — Other Ambulatory Visit (HOSPITAL_BASED_OUTPATIENT_CLINIC_OR_DEPARTMENT_OTHER): Payer: Self-pay

## 2024-03-09 ENCOUNTER — Encounter (HOSPITAL_BASED_OUTPATIENT_CLINIC_OR_DEPARTMENT_OTHER): Payer: Self-pay | Admitting: Student

## 2024-03-09 VITALS — BP 130/86 | HR 76 | Temp 98.7°F | Resp 16 | Ht 75.0 in | Wt 187.0 lb

## 2024-03-09 DIAGNOSIS — Z1211 Encounter for screening for malignant neoplasm of colon: Secondary | ICD-10-CM

## 2024-03-09 DIAGNOSIS — K219 Gastro-esophageal reflux disease without esophagitis: Secondary | ICD-10-CM

## 2024-03-09 DIAGNOSIS — F331 Major depressive disorder, recurrent, moderate: Secondary | ICD-10-CM | POA: Insufficient documentation

## 2024-03-09 DIAGNOSIS — K649 Unspecified hemorrhoids: Secondary | ICD-10-CM

## 2024-03-09 DIAGNOSIS — R7303 Prediabetes: Secondary | ICD-10-CM | POA: Diagnosis not present

## 2024-03-09 DIAGNOSIS — R5382 Chronic fatigue, unspecified: Secondary | ICD-10-CM | POA: Insufficient documentation

## 2024-03-09 DIAGNOSIS — Z720 Tobacco use: Secondary | ICD-10-CM

## 2024-03-09 MED ORDER — HYDROCORTISONE (PERIANAL) 2.5 % EX CREA
1.0000 | TOPICAL_CREAM | Freq: Two times a day (BID) | CUTANEOUS | 0 refills | Status: AC
  Filled 2024-03-09: qty 30, 15d supply, fill #0

## 2024-03-09 NOTE — Progress Notes (Signed)
 Established Patient Office Visit  Subjective   Patient ID: Evan Wood, male    DOB: 09-01-1980  Age: 43 y.o. MRN: [id number]  Chief Complaint  Patient presents with   Medical Management of Chronic Issues    Follow up. Feet and back are better. Protonix has helped tremendously. Has not taken any tums.    Hemorrhoids    Hemorrhoids having been bothering him a lot this week. Having rectal bleeding since Thursday when having a bowel movement.     HPI  Discussed the use of AI scribe software for clinical note transcription with the patient, who gave verbal consent to proceed.  History of Present Illness   Evan Wood is a 43 year old male who presents for follow-up.  He has been experiencing symptoms of depression, with a recent moderate depression score of 15. He has a history of using Zoloft and sertraline, which were not well-tolerated. He describes a lack of motivation and energy, particularly after work. There is a family history of depression on his mother's side.  He reports recurrent hemorrhoids with discomfort, pain, and bleeding, which have been exacerbated by moving office furniture. He has a history of hemorrhoids but does not require manual reduction.  Protonix has significantly improved his symptoms, allowing him to discontinue hydrocortisone and inhaler use. He notes improved sleep quality since starting Protonix.  He has a history of lower back pain which has resolved. He also mentions previous issues with rings on the inside of his legs, which have resolved with oral treatment.  He continues to smoke four to five cigarettes daily and uses nicotine pouches. He has quit vaping THC but occasionally consumes THC gummies.  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  He has a family history of diabetes, with his mother and grandfather being affected. He was previously concerned about prediabetes, but  attributes a false positive test to Azo use.      Patient Active Problem List   Diagnosis Date Noted   Prediabetes 03/09/2024   Moderate episode of recurrent major depressive disorder (HCC) 03/09/2024   Chronic fatigue 03/09/2024   Past Medical History:  Diagnosis Date   Allergy    Allergy-induced asthma    no current med.   Difficulty swallowing solids    meat   History of MRSA infection    x 2 - face and leg - ~ 2009, 2013   Irregular heartbeat    no cardiologist   Right clavicle fracture 08/20/2017   Sinus headache    Social History   Tobacco Use   Smoking status: Every Day    Current packs/day: 0.25    Average packs/day: 0.3 packs/day for 24.9 years (6.2 ttl pk-yrs)    Types: Cigarettes    Start date: 2001    Passive exposure: Current   Smokeless tobacco: Current    Types: Snuff  Vaping Use   Vaping status: Former   Substances: THC  Substance Use Topics   Alcohol use: Yes    Comment: occasionally   Drug use: Yes    Types: Marijuana    Comment: THC   Allergies  Allergen Reactions   Bee Venom Shortness Of Breath   Codeine Shortness Of Breath   Onion Shortness Of Breath   Grass Pollen(K-O-R-T-Swt Vern) Itching    LEAVES & GRASS - SNEEZING, ITCHY RED EYES, AND CONGESTION      ROS Per HPI.  Objective:     BP 130/86   Pulse 76   Temp 98.7 F (37.1 C) (Oral)   Resp 16   Ht 6' 3 (1.905 m)   Wt 187 lb (84.8 kg)   SpO2 97%   BMI 23.37 kg/m  BP Readings from Last 3 Encounters:  03/09/24 130/86  02/06/24 133/88  02/04/24 (!) 129/90   Wt Readings from Last 3 Encounters:  03/09/24 187 lb (84.8 kg)  02/06/24 191 lb (86.6 kg)  08/30/17 165 lb 6.4 oz (75 kg)      Physical Exam Constitutional:      General: He is not in acute distress.    Appearance: Normal appearance. He is not ill-appearing.  HENT:     Head: Normocephalic and atraumatic.     Right Ear: External ear normal.     Left Ear: External ear normal.     Nose: Nose normal.   Eyes:     Conjunctiva/sclera: Conjunctivae normal.  Cardiovascular:     Rate and Rhythm: Normal rate and regular rhythm.     Pulses: Normal pulses.     Heart sounds: Normal heart sounds. No murmur heard.    No friction rub.  Pulmonary:     Effort: Pulmonary effort is normal. No respiratory distress.     Breath sounds: Normal breath sounds. No wheezing, rhonchi or rales.  Musculoskeletal:     Right lower leg: No edema.     Left lower leg: No edema.  Skin:    General: Skin is warm and dry.     Coloration: Skin is not jaundiced or pale.  Neurological:     Mental Status: He is alert.  Psychiatric:        Mood and Affect: Mood normal.        Behavior: Behavior normal.      No results found for any visits on 03/09/24.  Last CBC Lab Results  Component Value Date   WBC 12.2 (H) 02/06/2024   HGB 16.9 02/06/2024   HCT 51.2 (H) 02/06/2024   MCV 95 02/06/2024   MCH 31.2 02/06/2024   RDW 12.3 02/06/2024   PLT 346 02/06/2024   Last metabolic panel Lab Results  Component Value Date   GLUCOSE 101 (H) 02/04/2024   NA 138 02/04/2024   K 4.1 02/04/2024   CL 104 02/04/2024   CO2 26 02/04/2024   BUN 18 02/04/2024   CREATININE 1.25 (H) 02/04/2024   GFRNONAA >60 02/04/2024   CALCIUM 9.4 02/04/2024   PROT 6.5 08/20/2017   ALBUMIN 4.3 08/20/2017   BILITOT 0.7 08/20/2017   ALKPHOS 69 08/20/2017   AST 33 08/20/2017   ALT 27 08/20/2017   ANIONGAP 8 02/04/2024   Last lipids No results found for: CHOL, HDL, LDLCALC, LDLDIRECT, TRIG, CHOLHDL Last hemoglobin A1c Lab Results  Component Value Date   HGBA1C 5.8 (H) 02/06/2024      The ASCVD Risk score (Arnett DK, et al., 2019) failed to calculate for the following reasons:   Cannot find a previous HDL lab   Cannot find a previous total cholesterol lab    Assessment & Plan:    Assessment and Plan    Hemorrhoids with rectal bleeding Chronic hemorrhoids with recent exacerbation causing discomfort, pain, and  rectal bleeding. No manual reduction required. Differential includes colon cancer, necessitating further evaluation. - Referred to GI for colon cancer screening and further evaluation - Prescribed hydrocortisone cream for hemorrhoids, to be used twice daily for no more than seven days in  a row - Advised use of over-the-counter lidocaine  for pain relief  Depression with chronic fatigue Moderate depression with chronic fatigue. Previous treatment with Zoloft and other medications was not well-tolerated. Current symptoms include lack of motivation and energy, possibly related to depression. Family history of depression noted. Discussed potential benefits of counseling and medication, with emphasis on individualized treatment approach. Consideration of genetic testing for medication response due to family history. - Scheduled lab visit for fasting testosterone and thyroid levels - Will consider genetic testing for depression medication response - Provided information on [facility] for potential counseling - Will discuss potential medication options at follow-up visit   GERD Chronic, stable. - Continue current regimen.  Prediabetes Chronic, stable.  - Will need to continue to watch diet.  Tobacco use Continued tobacco use with smoking of four to five cigarettes daily and use of nicotine pouches. No recent changes in smoking habits.  General Health Maintenance Discussed colon cancer screening due to rectal bleeding and hemorrhoids. - Referred to GI for colon cancer screening      Return in about 6 weeks (around 04/20/2024) for Depression and swab for gene site.    Shareen Capwell T Baylie Drakes, PA-C

## 2024-03-09 NOTE — Patient Instructions (Signed)
 It was nice to see you today!  As we discussed in clinic:  Counseling Resource: [facility] [address]  [place], [place] [address]  Phone: [phone]  Fax: [phone]   If you have any problems before your next visit feel free to message me via MyChart (minor issues or questions) or call the office, otherwise you may reach out to schedule an office visit.  Thank you! Gerilyn Pilgrim Warren Kugelman, PA-C

## 2024-03-23 ENCOUNTER — Other Ambulatory Visit (HOSPITAL_BASED_OUTPATIENT_CLINIC_OR_DEPARTMENT_OTHER)

## 2024-03-23 ENCOUNTER — Other Ambulatory Visit (HOSPITAL_BASED_OUTPATIENT_CLINIC_OR_DEPARTMENT_OTHER): Payer: Self-pay

## 2024-03-23 DIAGNOSIS — R5382 Chronic fatigue, unspecified: Secondary | ICD-10-CM

## 2024-03-24 ENCOUNTER — Ambulatory Visit (HOSPITAL_BASED_OUTPATIENT_CLINIC_OR_DEPARTMENT_OTHER): Payer: Self-pay | Admitting: Student

## 2024-03-24 ENCOUNTER — Other Ambulatory Visit (HOSPITAL_BASED_OUTPATIENT_CLINIC_OR_DEPARTMENT_OTHER): Payer: Self-pay | Admitting: Student

## 2024-03-24 DIAGNOSIS — E291 Testicular hypofunction: Secondary | ICD-10-CM

## 2024-03-24 LAB — TESTOSTERONE: Testosterone: 272 ng/dL (ref 264–916)

## 2024-03-24 LAB — TSH: TSH: 1.68 u[IU]/mL (ref 0.450–4.500)

## 2024-03-30 ENCOUNTER — Other Ambulatory Visit (HOSPITAL_BASED_OUTPATIENT_CLINIC_OR_DEPARTMENT_OTHER): Payer: Self-pay

## 2024-03-30 ENCOUNTER — Other Ambulatory Visit (HOSPITAL_BASED_OUTPATIENT_CLINIC_OR_DEPARTMENT_OTHER)

## 2024-03-30 DIAGNOSIS — E291 Testicular hypofunction: Secondary | ICD-10-CM

## 2024-03-31 LAB — FSH/LH
FSH: 4.4 m[IU]/mL (ref 1.5–12.4)
LH: 5 m[IU]/mL (ref 1.7–8.6)

## 2024-03-31 LAB — TESTOSTERONE, FREE, TOTAL, SHBG
Sex Hormone Binding: 15.4 nmol/L — ABNORMAL LOW (ref 16.5–55.9)
Testosterone, Free: 25.8 pg/mL — ABNORMAL HIGH (ref 6.8–21.5)
Testosterone: 318 ng/dL (ref 264–916)

## 2024-04-03 ENCOUNTER — Ambulatory Visit (HOSPITAL_BASED_OUTPATIENT_CLINIC_OR_DEPARTMENT_OTHER): Payer: Self-pay | Admitting: Student

## 2024-04-03 DIAGNOSIS — E291 Testicular hypofunction: Secondary | ICD-10-CM

## 2024-04-13 ENCOUNTER — Other Ambulatory Visit (HOSPITAL_BASED_OUTPATIENT_CLINIC_OR_DEPARTMENT_OTHER)

## 2024-04-13 ENCOUNTER — Other Ambulatory Visit (HOSPITAL_BASED_OUTPATIENT_CLINIC_OR_DEPARTMENT_OTHER): Payer: Self-pay

## 2024-04-13 DIAGNOSIS — E291 Testicular hypofunction: Secondary | ICD-10-CM

## 2024-04-14 ENCOUNTER — Ambulatory Visit (HOSPITAL_BASED_OUTPATIENT_CLINIC_OR_DEPARTMENT_OTHER): Payer: Self-pay | Admitting: Student

## 2024-04-14 LAB — CORTISOL: Cortisol: 19.4 ug/dL (ref 6.2–19.4)

## 2024-04-14 LAB — IRON,TIBC AND FERRITIN PANEL
Ferritin: 349 ng/mL (ref 30–400)
Iron Saturation: 55 % (ref 15–55)
Iron: 168 ug/dL (ref 38–169)
Total Iron Binding Capacity: 308 ug/dL (ref 250–450)
UIBC: 140 ug/dL (ref 111–343)

## 2024-04-14 LAB — PROLACTIN: Prolactin: 5.9 ng/mL (ref 3.9–22.7)

## 2024-04-20 ENCOUNTER — Ambulatory Visit (HOSPITAL_BASED_OUTPATIENT_CLINIC_OR_DEPARTMENT_OTHER): Admitting: Student

## 2024-04-20 ENCOUNTER — Encounter (HOSPITAL_BASED_OUTPATIENT_CLINIC_OR_DEPARTMENT_OTHER): Payer: Self-pay | Admitting: Student

## 2024-04-20 VITALS — BP 138/84 | HR 75 | Temp 98.2°F | Resp 16 | Ht 75.0 in | Wt 195.7 lb

## 2024-04-20 DIAGNOSIS — R002 Palpitations: Secondary | ICD-10-CM | POA: Insufficient documentation

## 2024-04-20 DIAGNOSIS — E291 Testicular hypofunction: Secondary | ICD-10-CM

## 2024-04-20 NOTE — Patient Instructions (Addendum)
 It was nice to see you today!  As we discussed in clinic:  For your reflux- you may try the following. Anti-reflux measures such as raising the head of the bed, avoiding tight clothing or belts, avoiding eating late at night and not lying down shortly after mealtime and achieving weight loss. Avoid Aspirin, NSAID's such as ibuprofen and aleve), caffeine, peppermints, alcohol, and tobacco.  If you have any problems before your next visit feel free to message me via MyChart (minor issues or questions) or call the office, otherwise you may reach out to schedule an office visit.  Thank you! Hope Holst, PA-C
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
## 2024-04-21 DIAGNOSIS — E291 Testicular hypofunction: Secondary | ICD-10-CM | POA: Insufficient documentation

## 2024-05-04 ENCOUNTER — Ambulatory Visit: Attending: Student

## 2024-05-04 DIAGNOSIS — R002 Palpitations: Secondary | ICD-10-CM

## 2024-05-19 ENCOUNTER — Encounter: Payer: Self-pay | Admitting: Gastroenterology

## 2024-05-28 ENCOUNTER — Ambulatory Visit (HOSPITAL_BASED_OUTPATIENT_CLINIC_OR_DEPARTMENT_OTHER): Payer: Self-pay | Admitting: Family Medicine

## 2024-05-28 DIAGNOSIS — R002 Palpitations: Secondary | ICD-10-CM

## 2024-06-15 ENCOUNTER — Ambulatory Visit: Admitting: Gastroenterology
